# Patient Record
Sex: Male | Born: 2000 | Race: Black or African American | Hispanic: No | Marital: Single | Smoking: Never smoker
Health system: Southern US, Community
[De-identification: ages and names within clinical notes are randomized; demographics above are authoritative.]

## PROBLEM LIST (undated history)

## (undated) DIAGNOSIS — R011 Cardiac murmur, unspecified: Secondary | ICD-10-CM

---
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
## 2019-06-15 IMAGING — US US ABDOMEN LIMITED
1 series · 11 of 11 positions shown · non-contrast
Comparison: None.

CLINICAL DATA: Periumbilical pain for 4 days. Elevated white blood
cell count.

EXAM:
ULTRASOUND ABDOMEN LIMITED
TECHNIQUE: Gray scale imaging of the right lower quadrant was performed to
evaluate for suspected appendicitis. Standard imaging planes and
graded compression technique were utilized.

[Series 1: us abdomen limited · 0.09mm/px · 11 acquisitions, 11 frames shown]
[im 1/11]
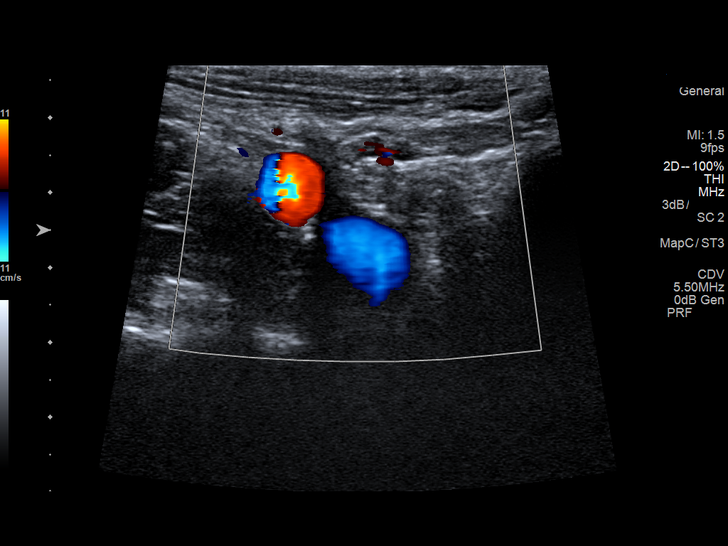
[im 2/11]
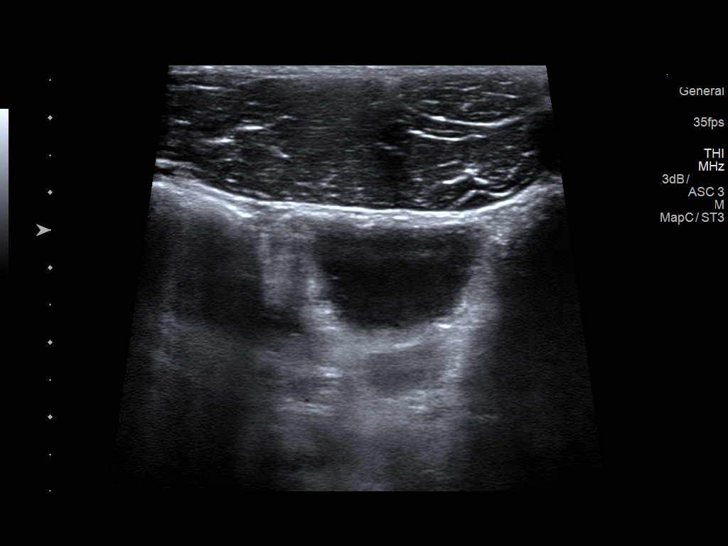
[im 3/11]
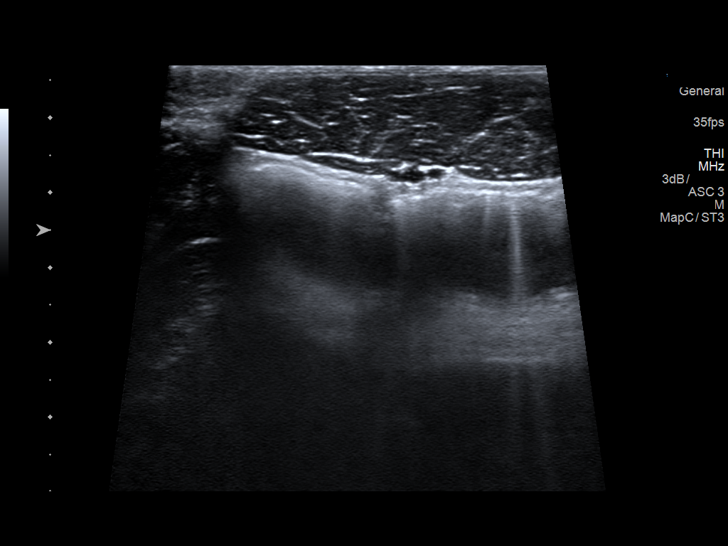
[im 4/11]
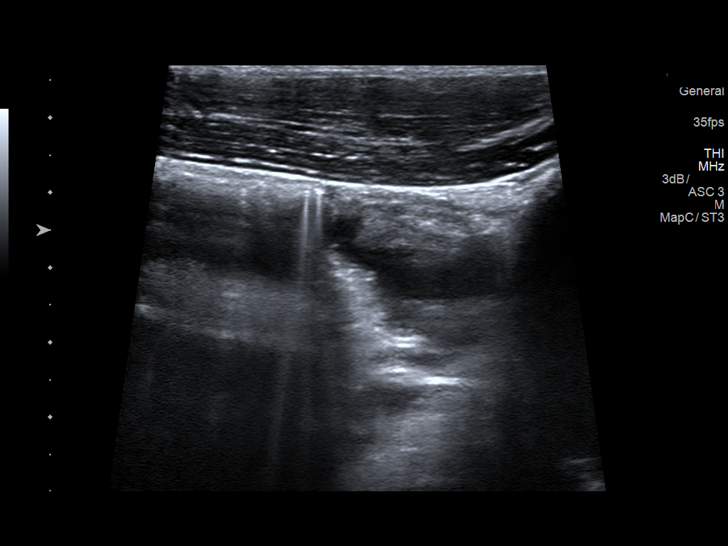
[im 5/11]
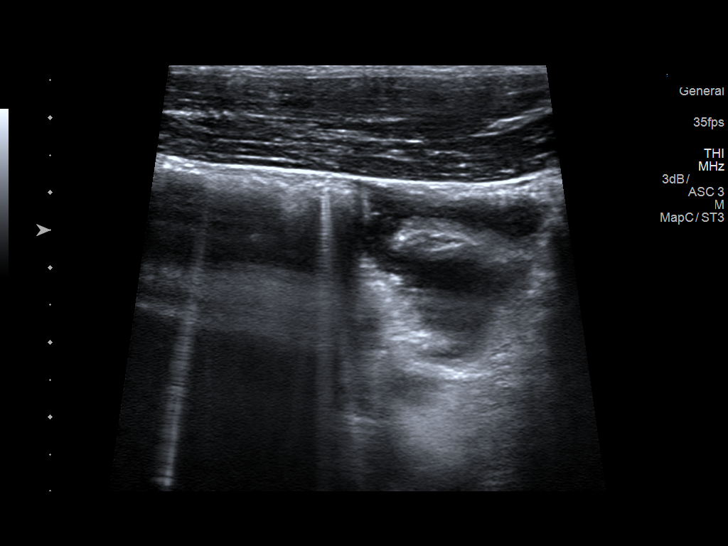
[im 6/11]
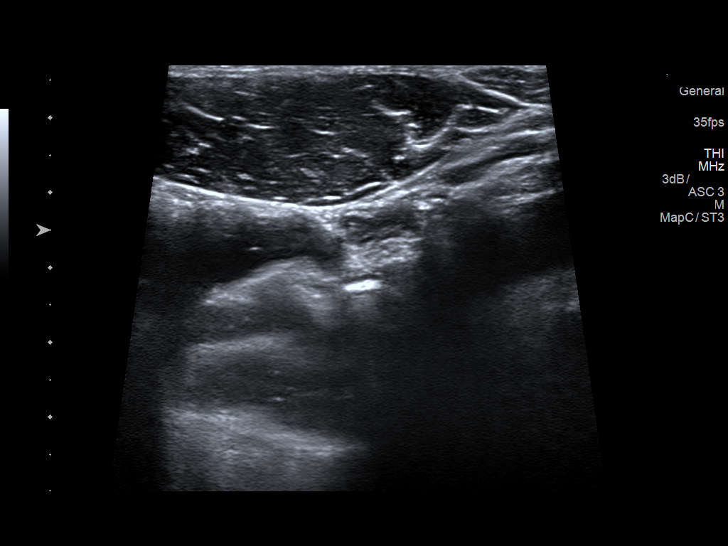
[im 7/11]
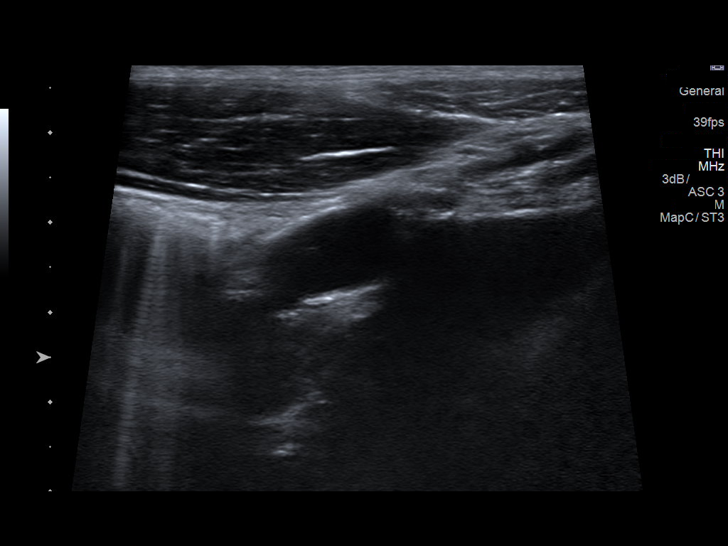
[im 8/11]
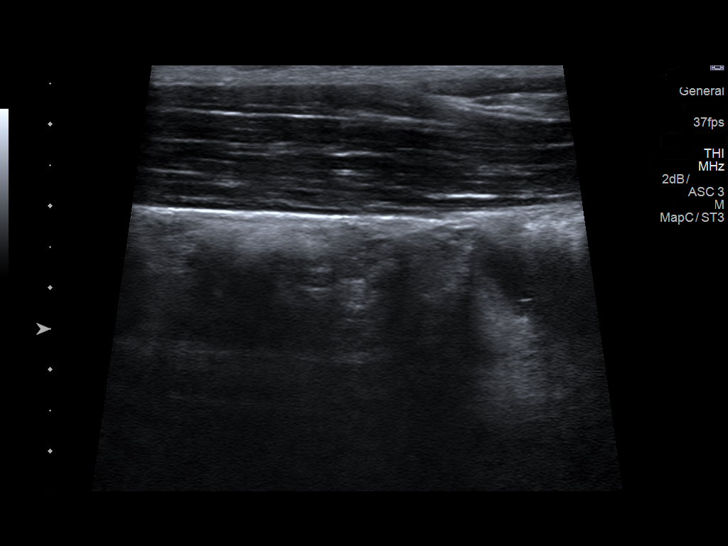
[im 9/11]
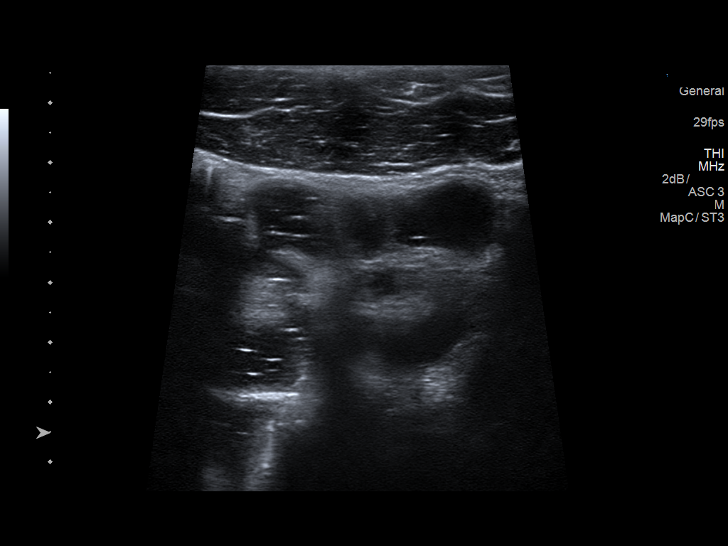
[im 10/11]
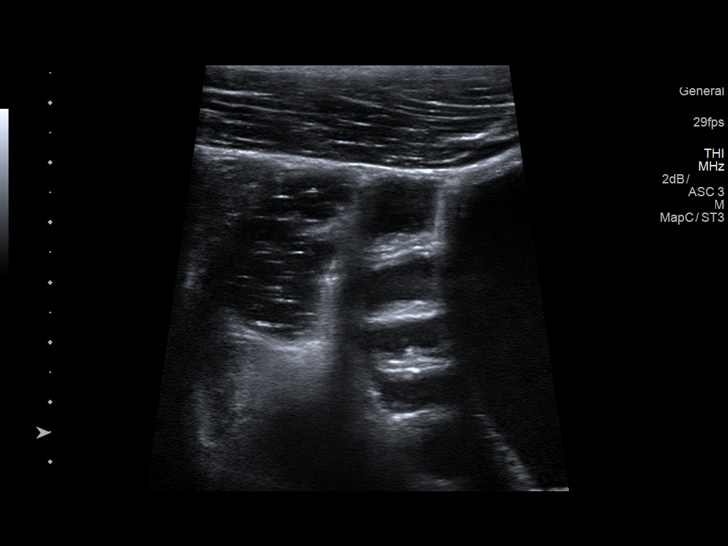
[im 11/11]
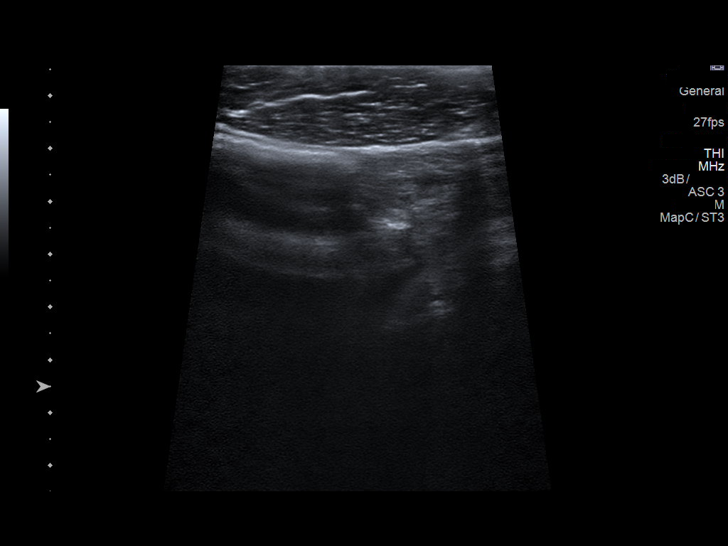

[11 of 11 positions shown; findings below may reference images not displayed]

FINDINGS: The appendix is not visualized.

Ancillary findings: None.

Factors affecting image quality: None.
IMPRESSION: The appendix is not visualized.  No abnormality is seen.

Note: Non-visualization of appendix by US does not definitely
exclude appendicitis. If there is sufficient clinical concern,
consider abdomen pelvis CT with contrast for further evaluation.

## 2024-03-30 ENCOUNTER — Emergency Department (HOSPITAL_COMMUNITY)

## 2024-03-30 ENCOUNTER — Encounter (HOSPITAL_COMMUNITY): Payer: Self-pay | Admitting: *Deleted

## 2024-03-30 ENCOUNTER — Emergency Department (HOSPITAL_COMMUNITY)
Admission: EM | Admit: 2024-03-30 | Discharge: 2024-03-30 | Disposition: A | Discharge status: Home / Self Care | Attending: Emergency Medicine | Admitting: Emergency Medicine

## 2024-03-30 ENCOUNTER — Other Ambulatory Visit: Payer: Self-pay

## 2024-03-30 DIAGNOSIS — R079 Chest pain, unspecified: Secondary | ICD-10-CM | POA: Insufficient documentation

## 2024-03-30 DIAGNOSIS — Z79899 Other long term (current) drug therapy: Secondary | ICD-10-CM | POA: Insufficient documentation

## 2024-03-30 DIAGNOSIS — R000 Tachycardia, unspecified: Secondary | ICD-10-CM | POA: Insufficient documentation

## 2024-03-30 DIAGNOSIS — R109 Unspecified abdominal pain: Secondary | ICD-10-CM | POA: Insufficient documentation

## 2024-03-30 DIAGNOSIS — R1013 Epigastric pain: Secondary | ICD-10-CM | POA: Diagnosis present

## 2024-03-30 DIAGNOSIS — R002 Palpitations: Secondary | ICD-10-CM | POA: Insufficient documentation

## 2024-03-30 DIAGNOSIS — R0689 Other abnormalities of breathing: Secondary | ICD-10-CM | POA: Diagnosis not present

## 2024-03-30 LAB — CBC
HCT: 44 % (ref 39.0–52.0)
Hemoglobin: 13.8 g/dL (ref 13.0–17.0)
MCH: 23.8 pg — ABNORMAL LOW (ref 26.0–34.0)
MCHC: 31.4 g/dL (ref 30.0–36.0)
MCV: 75.7 fL — ABNORMAL LOW (ref 80.0–100.0)
Platelets: 293 K/uL (ref 150–400)
RBC: 5.81 MIL/uL (ref 4.22–5.81)
RDW: 13.9 % (ref 11.5–15.5)
WBC: 11.2 K/uL — ABNORMAL HIGH (ref 4.0–10.5)
nRBC: 0 % (ref 0.0–0.2)

## 2024-03-30 LAB — HEPATIC FUNCTION PANEL
ALT: 23 U/L (ref 0–44)
AST: 22 U/L (ref 15–41)
Albumin: 4.7 g/dL (ref 3.5–5.0)
Alkaline Phosphatase: 54 U/L (ref 38–126)
Bilirubin, Direct: <0.1 mg/dL (ref 0.0–0.2)
Total Bilirubin: 0.7 mg/dL (ref 0.0–1.2)
Total Protein: 8 g/dL (ref 6.5–8.1)

## 2024-03-30 LAB — CBG MONITORING, ED: Glucose-Capillary: 89 mg/dL (ref 70–99)

## 2024-03-30 LAB — URINALYSIS, ROUTINE W REFLEX MICROSCOPIC
Bacteria, UA: NONE SEEN
Bilirubin Urine: NEGATIVE
Glucose, UA: NEGATIVE mg/dL
Hgb urine dipstick: NEGATIVE
Ketones, ur: NEGATIVE mg/dL
Leukocytes,Ua: NEGATIVE
Nitrite: NEGATIVE
Protein, ur: 30 mg/dL — AB
Specific Gravity, Urine: 1.027 (ref 1.005–1.030)
pH: 6 (ref 5.0–8.0)

## 2024-03-30 LAB — BRAIN NATRIURETIC PEPTIDE: B Natriuretic Peptide: 2.6 pg/mL (ref 0.0–100.0)

## 2024-03-30 LAB — BASIC METABOLIC PANEL WITH GFR
Anion gap: 13 (ref 5–15)
BUN: 17 mg/dL (ref 6–20)
CO2: 24 mmol/L (ref 22–32)
Calcium: 9.3 mg/dL (ref 8.9–10.3)
Chloride: 102 mmol/L (ref 98–111)
Creatinine, Ser: 1.43 mg/dL — ABNORMAL HIGH (ref 0.61–1.24)
GFR, Estimated: >60 mL/min (ref >60)
Glucose, Bld: 127 mg/dL — ABNORMAL HIGH (ref 70–99)
Potassium: 3.1 mmol/L — ABNORMAL LOW (ref 3.5–5.1)
Sodium: 139 mmol/L (ref 135–145)

## 2024-03-30 LAB — TROPONIN I (HIGH SENSITIVITY)
Troponin I (High Sensitivity): 6 ng/L (ref <18)
Troponin I (High Sensitivity): 8 ng/L (ref <18)

## 2024-03-30 LAB — RAPID URINE DRUG SCREEN, HOSP PERFORMED
Amphetamines: NOT DETECTED
Barbiturates: NOT DETECTED
Benzodiazepines: NOT DETECTED
Cocaine: NOT DETECTED
Opiates: POSITIVE — AB
Tetrahydrocannabinol: POSITIVE — AB

## 2024-03-30 LAB — D-DIMER, QUANTITATIVE: D-Dimer, Quant: <0.27 ug{FEU}/mL (ref 0.00–0.50)

## 2024-03-30 LAB — LIPASE, BLOOD: Lipase: 38 U/L (ref 11–51)

## 2024-03-30 MED ORDER — POTASSIUM CHLORIDE CRYS ER 20 MEQ PO TBCR
40.0000 meq | EXTENDED_RELEASE_TABLET | Freq: Once | ORAL | Status: AC
  Administered 2024-03-30: 40 meq via ORAL
  Filled 2024-03-30: qty 2

## 2024-03-30 MED ORDER — POTASSIUM CHLORIDE CRYS ER 20 MEQ PO TBCR
40.0000 meq | EXTENDED_RELEASE_TABLET | Freq: Every day | ORAL | 0 refills | Status: AC
Start: 2024-03-30 — End: 2024-04-06

## 2024-03-30 MED ORDER — PANTOPRAZOLE SODIUM 20 MG PO TBEC: 20.0000 mg | DELAYED_RELEASE_TABLET | Freq: Every day | ORAL | 0 refills | Status: AC

## 2024-03-30 MED ORDER — PANTOPRAZOLE SODIUM 40 MG IV SOLR
40.0000 mg | Freq: Once | INTRAVENOUS | Status: AC
  Administered 2024-03-30: 40 mg via INTRAVENOUS
  Filled 2024-03-30: qty 10

## 2024-03-30 MED ORDER — IOHEXOL 350 MG/ML SOLN
75.0000 mL | Freq: Once | INTRAVENOUS | Status: AC | PRN
  Administered 2024-03-30: 75 mL via INTRAVENOUS

## 2024-03-30 MED ORDER — LACTATED RINGERS IV BOLUS
1000.0000 mL | Freq: Once | INTRAVENOUS | Status: AC
  Administered 2024-03-30: 1000 mL via INTRAVENOUS

## 2024-03-30 NOTE — ED Provider Notes (Signed)
 [place] EMERGENCY DEPARTMENT AT [facility] Provider Note   CSN: [id number] Arrival date & time: 03/30/24  9843     Patient presents with: Abdominal Pain   Victor Key is a 23 y.o. male.  {Add pertinent medical, surgical, social history, OB history to HPI:6863} 23 year old male without significant past medical history who presents ER today with palpitations, epigastric pain and chest pain also feeling like he has dry mouth like he might be dehydrated.  Patient states this all started earlier today.  He had some epigastric discomfort felt like he had a throw up but when he tried to throw up nothing came out and that has since dissipated but he has felt like his heart is racing and he is kind of weak all over and having some intermittent episodes of sharp chest pain.  No shortness of breath.  No cough.  No fevers, diarrhea, constipation.  States he smokes marijuana but no other drugs.  No cigarettes, alcohol or over-the-counter medications.  No known medical history.   Abdominal Pain      Prior to Admission medications   Medication Sig Start Date End Date Taking? Authorizing Provider  mupirocin  cream (BACTROBAN ) 2 % Apply topically 3 (three) times daily. Patient not taking: Reported on 04/19/2018 05/19/13   Anitra Rocky KIDD, PA-C  ondansetron  (ZOFRAN -ODT) 4 MG disintegrating tablet Take 1 tablet (4 mg total) by mouth every 8 (eight) hours as needed for nausea or vomiting. 04/19/18   Story, Dorothyann RAMAN, NP    Allergies: Patient has no known allergies.    Review of Systems  Gastrointestinal:  Positive for abdominal pain.    Updated Vital Signs BP (!) 158/81 (BP Location: Right Arm)   Pulse (!) 107   Temp 98.2 F (36.8 C)   Resp 18   Ht 6' (1.829 m)   Wt 78.5 kg   SpO2 92%   BMI 23.47 kg/m   Physical Exam Vitals and nursing note reviewed.  Constitutional:      Appearance: He is well-developed.  HENT:     Head: Normocephalic and atraumatic.   Cardiovascular:     Rate and Rhythm: Tachycardia present.  Pulmonary:     Effort: Pulmonary effort is normal. No respiratory distress.  Abdominal:     General: There is no distension.     Tenderness: There is no abdominal tenderness.  Musculoskeletal:        General: Normal range of motion.     Cervical back: Normal range of motion.  Skin:    General: Skin is warm and dry.  Neurological:     General: No focal deficit present.     Mental Status: He is alert.     (all labs ordered are listed, but only abnormal results are displayed) Labs Reviewed  CBC - Abnormal; Notable for the following components:      Result Value   WBC 11.2 (*)    MCV 75.7 (*)    MCH 23.8 (*)    All other components within normal limits  BASIC METABOLIC PANEL WITH GFR  HEPATIC FUNCTION PANEL  BRAIN NATRIURETIC PEPTIDE  LIPASE, BLOOD  D-DIMER, QUANTITATIVE  CBG MONITORING, ED  TROPONIN I (HIGH SENSITIVITY)    EKG: EKG Interpretation Date/Time:  Thursday March 30 2024 02:04:47 EDT Ventricular Rate:  110 PR Interval:  154 QRS Duration:  94 QT Interval:  338 QTC Calculation: 457 R Axis:   81  Text Interpretation: Sinus tachycardia Biatrial enlargement ST & T wave abnormality, consider  inferior ischemia Abnormal ECG No previous ECGs available Confirmed by Lorette Mayo [phone]) on 03/30/2024 2:36:33 AM  Radiology: No results found.  {Document cardiac monitor, telemetry assessment procedure when appropriate:32947} Procedures   Medications Ordered in the ED  lactated ringers  bolus 1,000 mL (has no administration in time range)  pantoprazole  (PROTONIX ) injection 40 mg (has no administration in time range)      {Click here for ABCD2, HEART and other calculators REFRESH Note before signing:1}                              Medical Decision Making Amount and/or Complexity of Data Reviewed Labs: ordered. Radiology: ordered.  Risk Prescription drug management.  Patient with oxygen saturation of  92% on room air without any history also tachycardic and hypertensive consider possible pulmonary edema versus PE versus pneumonia.  Will get x-ray D-dimer troponin rest of labs.  Will give little fluid now as he is clinically dehydrated but will need to be stopped if his BNP or chest x-ray consistent with pulmonary edema. ***  {Document critical care time when appropriate  Document review of labs and clinical decision tools ie CHADS2VASC2, etc  Document your independent review of radiology images and any outside records  Document your discussion with family members, caretakers and with consultants  Document social determinants of health affecting pt's care  Document your decision making why or why not admission, treatments were needed:32947:::1}   Final diagnoses:  None    ED Discharge Orders     None

## 2024-03-30 NOTE — ED Triage Notes (Addendum)
 The pt is c/o abd pain for one  hour and he reports that he is nauseated but does not feel ike he can vomit.  Shortness of breath

## 2024-03-30 NOTE — ED Notes (Signed)
 Patient tolerated PO fluids without vomiting. Patient denies nausea at this time.

## 2024-03-30 NOTE — ED Notes (Signed)
 Oral hydration provided.

## 2024-03-30 NOTE — ED Notes (Signed)
 Discharge instructions reviewed.   Newly prescribed medications discussed. Pharmacy verified.   Opportunity for questions and concerns provided.   Alert, oriented and ambulatory. Displays no signs of distress.

## 2024-03-30 NOTE — ED Notes (Signed)
 Patient transported to X-ray

## 2024-08-26 ENCOUNTER — Other Ambulatory Visit: Payer: Self-pay

## 2024-08-26 ENCOUNTER — Emergency Department (HOSPITAL_COMMUNITY)

## 2024-08-26 ENCOUNTER — Emergency Department (HOSPITAL_COMMUNITY)
Admission: EM | Admit: 2024-08-26 | Discharge: 2024-08-26 | Disposition: A | Discharge status: Home / Self Care | Attending: Emergency Medicine | Admitting: Emergency Medicine

## 2024-08-26 ENCOUNTER — Encounter (HOSPITAL_COMMUNITY): Payer: Self-pay | Admitting: Emergency Medicine

## 2024-08-26 ENCOUNTER — Encounter: Payer: Self-pay | Admitting: *Deleted

## 2024-08-26 ENCOUNTER — Ambulatory Visit: Admission: EM | Admit: 2024-08-26 | Discharge: 2024-08-26 | Disposition: A | Discharge status: Home / Self Care

## 2024-08-26 DIAGNOSIS — J029 Acute pharyngitis, unspecified: Secondary | ICD-10-CM | POA: Insufficient documentation

## 2024-08-26 DIAGNOSIS — K389 Disease of appendix, unspecified: Secondary | ICD-10-CM

## 2024-08-26 DIAGNOSIS — K381 Appendicular concretions: Secondary | ICD-10-CM | POA: Diagnosis not present

## 2024-08-26 DIAGNOSIS — R109 Unspecified abdominal pain: Secondary | ICD-10-CM

## 2024-08-26 DIAGNOSIS — R509 Fever, unspecified: Secondary | ICD-10-CM | POA: Insufficient documentation

## 2024-08-26 LAB — GROUP A STREP BY PCR: Group A Strep by PCR: NOT DETECTED

## 2024-08-26 LAB — URINALYSIS, ROUTINE W REFLEX MICROSCOPIC
Bacteria, UA: NONE SEEN
Glucose, UA: NEGATIVE mg/dL
Hgb urine dipstick: NEGATIVE
Ketones, ur: 20 mg/dL — AB
Leukocytes,Ua: NEGATIVE
Nitrite: NEGATIVE
Protein, ur: >=300 mg/dL — AB
Specific Gravity, Urine: 1.044 — ABNORMAL HIGH (ref 1.005–1.030)
pH: 6 (ref 5.0–8.0)

## 2024-08-26 LAB — COMPREHENSIVE METABOLIC PANEL WITH GFR
ALT: 8 U/L (ref 0–44)
AST: 20 U/L (ref 15–41)
Albumin: 4.2 g/dL (ref 3.5–5.0)
Alkaline Phosphatase: 50 U/L (ref 38–126)
Anion gap: 14 (ref 5–15)
BUN: 17 mg/dL (ref 6–20)
CO2: 22 mmol/L (ref 22–32)
Calcium: 9.4 mg/dL (ref 8.9–10.3)
Chloride: 100 mmol/L (ref 98–111)
Creatinine, Ser: 1.16 mg/dL (ref 0.61–1.24)
GFR, Estimated: >60 mL/min
Glucose, Bld: 123 mg/dL — ABNORMAL HIGH (ref 70–99)
Potassium: 3.5 mmol/L (ref 3.5–5.1)
Sodium: 136 mmol/L (ref 135–145)
Total Bilirubin: 0.5 mg/dL (ref 0.0–1.2)
Total Protein: 7.5 g/dL (ref 6.5–8.1)

## 2024-08-26 LAB — CBC
HCT: 38.7 % — ABNORMAL LOW (ref 39.0–52.0)
Hemoglobin: 12.7 g/dL — ABNORMAL LOW (ref 13.0–17.0)
MCH: 24.2 pg — ABNORMAL LOW (ref 26.0–34.0)
MCHC: 32.8 g/dL (ref 30.0–36.0)
MCV: 73.7 fL — ABNORMAL LOW (ref 80.0–100.0)
Platelets: 213 K/uL (ref 150–400)
RBC: 5.25 MIL/uL (ref 4.22–5.81)
RDW: 14.1 % (ref 11.5–15.5)
WBC: 17.8 K/uL — ABNORMAL HIGH (ref 4.0–10.5)
nRBC: 0 % (ref 0.0–0.2)

## 2024-08-26 LAB — RESP PANEL BY RT-PCR (RSV, FLU A&B, COVID)  RVPGX2
Influenza A by PCR: NEGATIVE
Influenza B by PCR: NEGATIVE
Resp Syncytial Virus by PCR: NEGATIVE
SARS Coronavirus 2 by RT PCR: NEGATIVE

## 2024-08-26 LAB — MONONUCLEOSIS SCREEN: Mono Screen: NEGATIVE

## 2024-08-26 LAB — POCT INFLUENZA A/B
Influenza A, POC: NEGATIVE
Influenza B, POC: NEGATIVE

## 2024-08-26 MED ORDER — LACTATED RINGERS IV BOLUS
1000.0000 mL | Freq: Once | INTRAVENOUS | Status: AC
  Administered 2024-08-26: 1000 mL via INTRAVENOUS

## 2024-08-26 MED ORDER — ACETAMINOPHEN 500 MG PO TABS
1000.0000 mg | ORAL_TABLET | Freq: Once | ORAL | Status: AC | PRN
  Administered 2024-08-26: 1000 mg via ORAL
  Filled 2024-08-26: qty 2

## 2024-08-26 MED ORDER — ACETAMINOPHEN 500 MG PO TABS: 1000.0000 mg | ORAL_TABLET | Freq: Once | ORAL | Status: DC

## 2024-08-26 MED ORDER — AMOXICILLIN-POT CLAVULANATE 875-125 MG PO TABS: 1.0000 | ORAL_TABLET | Freq: Two times a day (BID) | ORAL | 0 refills | Status: AC

## 2024-08-26 MED ORDER — KETOROLAC TROMETHAMINE 30 MG/ML IJ SOLN
30.0000 mg | Freq: Once | INTRAMUSCULAR | Status: AC
  Administered 2024-08-26: 30 mg via INTRAMUSCULAR

## 2024-08-26 MED ORDER — ACETAMINOPHEN 325 MG PO TABS
650.0000 mg | ORAL_TABLET | Freq: Once | ORAL | Status: AC
  Administered 2024-08-26: 650 mg via ORAL

## 2024-08-26 MED ORDER — IBUPROFEN 400 MG PO TABS
600.0000 mg | ORAL_TABLET | Freq: Once | ORAL | Status: AC
  Administered 2024-08-26: 600 mg via ORAL
  Filled 2024-08-26: qty 1

## 2024-08-26 MED ORDER — ONDANSETRON 4 MG PO TBDP
4.0000 mg | ORAL_TABLET | Freq: Once | ORAL | Status: AC
  Administered 2024-08-26: 4 mg via ORAL

## 2024-08-26 MED ORDER — PIPERACILLIN-TAZOBACTAM 3.375 G IVPB 30 MIN
3.3750 g | Freq: Once | INTRAVENOUS | Status: AC
  Administered 2024-08-26: 3.375 g via INTRAVENOUS
  Filled 2024-08-26: qty 50

## 2024-08-26 MED ORDER — IOHEXOL 350 MG/ML SOLN
75.0000 mL | Freq: Once | INTRAVENOUS | Status: AC | PRN
  Administered 2024-08-26: 75 mL via INTRAVENOUS

## 2024-08-26 NOTE — ED Triage Notes (Addendum)
 Pt reports abdominal pain with nausea and vomiting x 3 days. Tried taking theraflu without relief. Last emesis just prior to arrival. States my stomach hurts so bad, points to mid abdomen. He thinks his last BM was Monday. Endorses THC use, last was 2 weeks ago. When asked states I have had a cough and body aches.

## 2024-08-26 NOTE — ED Notes (Signed)
 Patient is being discharged from the Urgent Care and sent to the Emergency Department via EMS . Per Corean Endo, PA-C, patient is in need of higher level of care due to fever and abd pain. Patient is aware and verbalizes understanding of plan of care.  Vitals:   08/26/24 0918  BP: 123/75  Pulse: (!) 125  Resp: 18  Temp: (!) 103 F (39.4 C)  SpO2: 97%

## 2024-08-26 NOTE — Consult Note (Signed)
 "    Victor Key [birth year]  [id number].    Requesting MD: Bernard Chief Complaint/Reason for Consult: ?appendicitis  HPI:  23 y/o M who presented to the ED with 5 days of generalized abdominal pain, nausea, and emesis as well as a sore throat. He underwent a CT that showed an appendix at the upper limit of normal with several appendicoliths without surrounding inflammation. Of note, he had a scan from July that showed a similar appearing appendix.  On exam, patient is resting in bed. NAD. He reports being somewhat hungry and asks if he can leave soon.  WBC 18. Tmax 102, HR 100s  ROS: Review of Systems  Constitutional:  Positive for fever and malaise/fatigue.  HENT: Negative.    Eyes: Negative.   Respiratory: Negative.    Cardiovascular: Negative.   Gastrointestinal:  Positive for abdominal pain, nausea and vomiting.  Genitourinary: Negative.   Musculoskeletal: Negative.   Skin: Negative.   Neurological: Negative.   Endo/Heme/Allergies: Negative.   Psychiatric/Behavioral: Negative.      History reviewed. No pertinent family history.  Past Medical History:  Diagnosis Date   Heart murmur     History reviewed. No pertinent surgical history.  Social History:  reports that he has never smoked. He has never used smokeless tobacco. He reports that he does not currently use alcohol. He reports current drug use. Drug: Marijuana.  Allergies: Allergies[1]  (Not in a hospital admission)   Physical Exam: Blood pressure 124/73, pulse 90, temperature (!) 102 F (38.9 C), temperature source Oral, resp. rate 16, height 6' (1.829 m), weight 78 kg, SpO2 100%. Gen: male, NAD Abd: soft, non-distended, mild TTP in the periumbilical and epigastric region, no rebound/guarding, no peritoneal signs  Results for orders placed or performed during the hospital encounter of 08/26/24 (from the past 48 hours)  CBC     Status: Abnormal   Collection Time: 08/26/24 11:19 AM  Result Value Ref  Range   WBC 17.8 (H) 4.0 - 10.5 K/uL   RBC 5.25 4.22 - 5.81 MIL/uL   Hemoglobin 12.7 (L) 13.0 - 17.0 g/dL   HCT 61.2 (L) 60.9 - 47.9 %   MCV 73.7 (L) 80.0 - 100.0 fL   MCH 24.2 (L) 26.0 - 34.0 pg   MCHC 32.8 30.0 - 36.0 g/dL   RDW 85.8 88.4 - 84.4 %   Platelets 213 150 - 400 K/uL   nRBC 0.0 0.0 - 0.2 %    Comment: Performed at [facility], [address] [place], [place] [address]  Comprehensive metabolic panel with GFR     Status: Abnormal   Collection Time: 08/26/24 11:19 AM  Result Value Ref Range   Sodium 136 135 - 145 mmol/L   Potassium 3.5 3.5 - 5.1 mmol/L   Chloride 100 98 - 111 mmol/L   CO2 22 22 - 32 mmol/L   Glucose, Bld 123 (H) 70 - 99 mg/dL    Comment: Glucose reference range applies only to samples taken after fasting for at least 8 hours.   BUN 17 6 - 20 mg/dL   Creatinine, Ser 8.83 0.61 - 1.24 mg/dL   Calcium  9.4 8.9 - 10.3 mg/dL   Total Protein 7.5 6.5 - 8.1 g/dL   Albumin 4.2 3.5 - 5.0 g/dL   AST 20 15 - 41 U/L   ALT 8 0 - 44 U/L   Alkaline Phosphatase 50 38 - 126 U/L   Total Bilirubin 0.5 0.0 - 1.2 mg/dL  GFR, Estimated >60 >60 mL/min    Comment: (NOTE) Calculated using the CKD-EPI Creatinine Equation (2021)    Anion gap 14 5 - 15    Comment: Performed at [facility], [address], [place], [place] [address]  Resp panel by RT-PCR (RSV, Flu A&B, Covid) Anterior Nasal Swab     Status: None   Collection Time: 08/26/24 11:19 AM   Specimen: Anterior Nasal Swab  Result Value Ref Range   SARS Coronavirus 2 by RT PCR NEGATIVE NEGATIVE   Influenza A by PCR NEGATIVE NEGATIVE   Influenza B by PCR NEGATIVE NEGATIVE    Comment: (NOTE) The Xpert Xpress SARS-CoV-2/FLU/RSV plus assay is intended as an aid in the diagnosis of influenza from Nasopharyngeal swab specimens and should not be used as a sole basis for treatment. Nasal washings and aspirates are unacceptable for Xpert Xpress SARS-CoV-2/FLU/RSV testing.  Fact Sheet for  Patients: bloggercourse.com  Fact Sheet for Healthcare Providers: seriousbroker.it  This test is not yet approved or cleared by the [place]  FDA and has been authorized for detection and/or diagnosis of SARS-CoV-2 by FDA under an Emergency Use Authorization (EUA). This EUA will remain in effect (meaning this test can be used) for the duration of the COVID-19 declaration under Section 564(b)(1) of the Act, 21 U.S.C. section 360bbb-3(b)(1), unless the authorization is terminated or revoked.     Resp Syncytial Virus by PCR NEGATIVE NEGATIVE    Comment: (NOTE) Fact Sheet for Patients: bloggercourse.com  Fact Sheet for Healthcare Providers: seriousbroker.it  This test is not yet approved or cleared by the [place]  FDA and has been authorized for detection and/or diagnosis of SARS-CoV-2 by FDA under an Emergency Use Authorization (EUA). This EUA will remain in effect (meaning this test can be used) for the duration of the COVID-19 declaration under Section 564(b)(1) of the Act, 21 U.S.C. section 360bbb-3(b)(1), unless the authorization is terminated or revoked.  Performed at [facility], [address], [place], [place] [address]   Group A Strep by PCR     Status: None   Collection Time: 08/26/24 11:19 AM   Specimen: Anterior Nasal Swab; Sterile Swab  Result Value Ref Range   Group A Strep by PCR NOT DETECTED NOT DETECTED    Comment: Performed at [facility], [address], [place], [place] [address]  Mononucleosis screen     Status: None   Collection Time: 08/26/24  3:30 PM  Result Value Ref Range   Mono Screen NEGATIVE NEGATIVE    Comment: Performed at [facility], [address], [place], [place] [address]  Urinalysis, Routine w reflex microscopic -Urine, Clean Catch     Status: Abnormal   Collection Time: 08/26/24  5:19 PM  Result Value Ref  Range   Color, Urine AMBER (A) YELLOW    Comment: BIOCHEMICALS MAY BE AFFECTED BY COLOR   APPearance HAZY (A) CLEAR   Specific Gravity, Urine 1.044 (H) 1.005 - 1.030   pH 6.0 5.0 - 8.0   Glucose, UA NEGATIVE NEGATIVE mg/dL   Hgb urine dipstick NEGATIVE NEGATIVE   Bilirubin Urine SMALL (A) NEGATIVE   Ketones, ur 20 (A) NEGATIVE mg/dL   Protein, ur >=699 (A) NEGATIVE mg/dL   Nitrite NEGATIVE NEGATIVE   Leukocytes,Ua NEGATIVE NEGATIVE   RBC / HPF 21-50 0 - 5 RBC/hpf   WBC, UA 0-5 0 - 5 WBC/hpf   Bacteria, UA NONE SEEN NONE SEEN   Squamous Epithelial / HPF 0-5  0 - 5 /HPF   Mucus PRESENT     Comment: Performed at [facility], [address], [place], [place] [address]   CT ABDOMEN PELVIS W CONTRAST Result Date: 08/26/2024 CLINICAL DATA:  Abdominal pain, acute nonlocalized. Right lower quadrant abdominal pain on palpation. Fever. EXAM: CT ABDOMEN AND PELVIS WITH CONTRAST TECHNIQUE: Multidetector CT imaging of the abdomen and pelvis was performed using the standard protocol following bolus administration of intravenous contrast. RADIATION DOSE REDUCTION: This exam was performed according to the departmental dose-optimization program which includes automated exposure control, adjustment of the mA and/or kV according to patient size and/or use of iterative reconstruction technique. CONTRAST:  75mL OMNIPAQUE  IOHEXOL  350 MG/ML SOLN COMPARISON:  03/30/2024. FINDINGS: Lower chest: No acute abnormality. Hepatobiliary: No focal liver abnormality is seen. No gallstones, gallbladder wall thickening, or biliary dilatation. Pancreas: Unremarkable. No pancreatic ductal dilatation or surrounding inflammatory changes. Spleen: Normal in size without focal abnormality. Adrenals/Urinary Tract: The adrenal glands are within normal limits. The kidneys enhance symmetrically. No renal calculus or hydronephrosis bilaterally. The bladder is unremarkable. Stomach/Bowel: The stomach is within normal limits. No bowel  obstruction, free air, or pneumatosis is seen. The appendix is prominent at 8 mm and contains appendicoliths, unchanged from the previous exam. No periappendiceal fat stranding is present. Vascular/Lymphatic: No significant vascular findings are present. No enlarged abdominal or pelvic lymph nodes. Reproductive: Prostate is unremarkable. Other: No abdominopelvic ascites. A fat containing umbilical hernia is noted. Musculoskeletal: No acute osseous abnormality. IMPRESSION: Prominent appendix with appendicoliths, unchanged from the previous exam. No definite acute fat stranding is seen at this time. Correlate clinically to exclude early appendicitis. Electronically Signed   By: Leita Birmingham M.D.   On: 08/26/2024 17:28    Assessment/Plan 23 y/o M with abdominal pain and ?appendicitis   - His exam and history are more consistent with a viral illness or possibly related to pharyngitis. His appendix is only slightly above normal and there are no additional signs to suggest appendicitis. I offered to follow up in the AM to determine possible need for OR but the patient preferred to leave from the ED with plans to return if his symptoms recurred - Dispo per ED  Cordella DELENA Polly Marlis [facility] 08/26/2024, 11:19 PM Please see Amion for pager number during day hours 7:00am-4:30pm or 7:00am -11:30am on weekends     [1] No Known Allergies  "

## 2024-08-26 NOTE — ED Notes (Signed)
Provided pt with fan

## 2024-08-26 NOTE — ED Notes (Signed)
 EMS at bedside

## 2024-08-26 NOTE — ED Notes (Signed)
 Pt requesting meds for a headache. MD notified

## 2024-08-26 NOTE — ED Triage Notes (Signed)
 Pt BIB [facility] from [facility] for RLQ abd pain on palpation.  NV since last night. Febrile at 103.5 at [facility].    650 Tylenol , 4mg  Zofran , 30 mg toradol  IM,  128/78, ETC02 19, HR 106 w/ freq PVC, CBG 162.  16G L. FA. 1L LR.  128/78  10/10 pain initially, 8/10 pain at this time.

## 2024-08-26 NOTE — ED Notes (Signed)
 Secure chat sent to MD due to 102.6 temp. Standing triage orders placed for 1000mg  tylenol .

## 2024-08-26 NOTE — Discharge Instructions (Addendum)
 Concern for appendicitis, patient is requesting EMS transport

## 2024-08-26 NOTE — Discharge Instructions (Signed)
 It was our pleasure to provide your ER care today - we hope that you feel better.  Drink plenty of fluids/stay well hydrated. Take augmentin  (antibiotic) as prescribed. Take acetaminophen  or ibuprofen  as need.   Return for recheck if reconsider and wish to be admitted. Also, return for recheck tomorrow AM if abdominal pain fails to improve/resolve.   Return to ER right away if worse, new symptoms, unable to swallow, trouble breathing, new/severe pain, worsening or severe abdominal pain, severe headache, neck pain/stiffness, or other concern.

## 2024-08-26 NOTE — ED Provider Notes (Signed)
 " [facility] Provider Note   CSN: [id number] Arrival date & time: 08/26/24  1024     Patient presents with: Abdominal Pain, Fever, Nausea, and Emesis   Victor Key is a 23 y.o. male.   Pt c/o fever, abd pain, nv, and sore throat in the past few days. Abd pain currently improved, was generalized, dull, non radiating, without specific exacerbating or alleviating factors. No back/flank pain. No dysuria or gu c/o. A couple episodes nv, no bloody or bilious emesis. Having normal bms. Sore throat is general/bilateral, no trouble breathing or swallowing. No specific known ill contacts, no known covid, strep, flu, or mono exposure. No runny nose or sinus pain. No headache. No neck pain or stiffness. No cough.  No extremity pain or swelling. No rash. Went to urgent care and was sent to ED.  The history is provided by the patient and medical records.  Abdominal Pain Associated symptoms: fever, sore throat and vomiting   Associated symptoms: no chest pain, no constipation, no cough, no diarrhea, no dysuria and no shortness of breath   Fever Associated symptoms: sore throat and vomiting   Associated symptoms: no chest pain, no cough, no diarrhea, no dysuria, no headaches and no rash   Emesis Associated symptoms: abdominal pain, fever and sore throat   Associated symptoms: no cough, no diarrhea and no headaches        Prior to Admission medications  Medication Sig Start Date End Date Taking? Authorizing Provider  amoxicillin -clavulanate (AUGMENTIN ) 875-125 MG tablet Take 1 tablet by mouth every 12 (twelve) hours. 08/27/24  Yes Bernard Drivers, MD  mupirocin  cream (BACTROBAN ) 2 % Apply topically 3 (three) times daily. Patient not taking: Reported on 04/19/2018 05/19/13   Anitra Rocky KIDD, PA-C  ondansetron  (ZOFRAN -ODT) 4 MG disintegrating tablet Take 1 tablet (4 mg total) by mouth every 8 (eight) hours as needed for nausea or vomiting. Patient not  taking: Reported on 08/26/2024 04/19/18   Lum Dorothyann RAMAN, NP  pantoprazole  (PROTONIX ) 20 MG tablet Take 1 tablet (20 mg total) by mouth daily. Patient not taking: Reported on 08/26/2024 03/30/24   Mesner, Selinda, MD  potassium chloride  SA (KLOR-CON  M) 20 MEQ tablet Take 2 tablets (40 mEq total) by mouth daily for 7 days. Patient not taking: Reported on 08/26/2024 03/30/24 04/06/24  Mesner, Selinda, MD    Allergies: Patient has no known allergies.    Review of Systems  Constitutional:  Positive for fever.  HENT:  Positive for sore throat. Negative for sinus pain.   Eyes:  Negative for pain, discharge and redness.  Respiratory:  Negative for cough and shortness of breath.   Cardiovascular:  Negative for chest pain.  Gastrointestinal:  Positive for abdominal pain and vomiting. Negative for constipation and diarrhea.  Genitourinary:  Negative for dysuria, flank pain, scrotal swelling and testicular pain.  Musculoskeletal:  Negative for back pain, neck pain and neck stiffness.  Skin:  Negative for rash.  Neurological:  Negative for headaches.    Updated Vital Signs BP 134/78   Pulse 91   Temp (!) 102.1 F (38.9 C) (Oral)   Resp 16   Ht 1.829 m (6')   Wt 78 kg   SpO2 100%   BMI 23.32 kg/m   Physical Exam Vitals and nursing note reviewed.  Constitutional:      Appearance: Normal appearance. He is well-developed.  HENT:     Head: Atraumatic.     Right Ear: Tympanic membrane  and ear canal normal.     Left Ear: Tympanic membrane and ear canal normal.     Nose: Nose normal.     Mouth/Throat:     Mouth: Mucous membranes are moist.     Pharynx: Oropharyngeal exudate and posterior oropharyngeal erythema present.     Comments: Pharynx erythematous w exudate. No asymmetric swelling or abscess. No trismus.  Eyes:     General: No scleral icterus.    Conjunctiva/sclera: Conjunctivae normal.     Pupils: Pupils are equal, round, and reactive to light.  Neck:     Trachea: No tracheal  deviation.     Comments: Anterior cervical l/a, no posterior cervical or other l/a appreciated. Trachea midline, thyroid not grossly enlarged or tender. No neck stiffness or rigidity.  Cardiovascular:     Rate and Rhythm: Normal rate and regular rhythm.     Pulses: Normal pulses.     Heart sounds: Normal heart sounds. No murmur heard.    No friction rub. No gallop.  Pulmonary:     Effort: Pulmonary effort is normal. No accessory muscle usage or respiratory distress.     Breath sounds: Normal breath sounds.  Abdominal:     General: Bowel sounds are normal. There is no distension.     Palpations: Abdomen is soft.     Tenderness: There is no abdominal tenderness. There is no guarding.     Comments: No HSM.  Genitourinary:    Comments: No cva tenderness. Musculoskeletal:        General: No swelling or tenderness.     Cervical back: Normal range of motion and neck supple. No rigidity.     Right lower leg: No edema.     Left lower leg: No edema.  Skin:    General: Skin is warm and dry.     Findings: No rash.  Neurological:     Mental Status: He is alert.     Comments: Alert, speech clear.   Psychiatric:        Mood and Affect: Mood normal.     (all labs ordered are listed, but only abnormal results are displayed) Results for orders placed or performed during the hospital encounter of 08/26/24  Resp panel by RT-PCR (RSV, Flu A&B, Covid) Anterior Nasal Swab   Collection Time: 08/26/24 11:19 AM   Specimen: Anterior Nasal Swab  Result Value Ref Range   SARS Coronavirus 2 by RT PCR NEGATIVE NEGATIVE   Influenza A by PCR NEGATIVE NEGATIVE   Influenza B by PCR NEGATIVE NEGATIVE   Resp Syncytial Virus by PCR NEGATIVE NEGATIVE  Group A Strep by PCR   Collection Time: 08/26/24 11:19 AM   Specimen: Anterior Nasal Swab; Sterile Swab  Result Value Ref Range   Group A Strep by PCR NOT DETECTED NOT DETECTED  CBC   Collection Time: 08/26/24 11:19 AM  Result Value Ref Range   WBC 17.8 (H)  4.0 - 10.5 K/uL   RBC 5.25 4.22 - 5.81 MIL/uL   Hemoglobin 12.7 (L) 13.0 - 17.0 g/dL   HCT 61.2 (L) 60.9 - 47.9 %   MCV 73.7 (L) 80.0 - 100.0 fL   MCH 24.2 (L) 26.0 - 34.0 pg   MCHC 32.8 30.0 - 36.0 g/dL   RDW 85.8 88.4 - 84.4 %   Platelets 213 150 - 400 K/uL   nRBC 0.0 0.0 - 0.2 %  Comprehensive metabolic panel with GFR   Collection Time: 08/26/24 11:19 AM  Result Value Ref Range  Sodium 136 135 - 145 mmol/L   Potassium 3.5 3.5 - 5.1 mmol/L   Chloride 100 98 - 111 mmol/L   CO2 22 22 - 32 mmol/L   Glucose, Bld 123 (H) 70 - 99 mg/dL   BUN 17 6 - 20 mg/dL   Creatinine, Ser 8.83 0.61 - 1.24 mg/dL   Calcium  9.4 8.9 - 10.3 mg/dL   Total Protein 7.5 6.5 - 8.1 g/dL   Albumin 4.2 3.5 - 5.0 g/dL   AST 20 15 - 41 U/L   ALT 8 0 - 44 U/L   Alkaline Phosphatase 50 38 - 126 U/L   Total Bilirubin 0.5 0.0 - 1.2 mg/dL   GFR, Estimated >39 >39 mL/min   Anion gap 14 5 - 15  Mononucleosis screen   Collection Time: 08/26/24  3:30 PM  Result Value Ref Range   Mono Screen NEGATIVE NEGATIVE  Urinalysis, Routine w reflex microscopic -Urine, Clean Catch   Collection Time: 08/26/24  5:19 PM  Result Value Ref Range   Color, Urine AMBER (A) YELLOW   APPearance HAZY (A) CLEAR   Specific Gravity, Urine 1.044 (H) 1.005 - 1.030   pH 6.0 5.0 - 8.0   Glucose, UA NEGATIVE NEGATIVE mg/dL   Hgb urine dipstick NEGATIVE NEGATIVE   Bilirubin Urine SMALL (A) NEGATIVE   Ketones, ur 20 (A) NEGATIVE mg/dL   Protein, ur >=699 (A) NEGATIVE mg/dL   Nitrite NEGATIVE NEGATIVE   Leukocytes,Ua NEGATIVE NEGATIVE   RBC / HPF 21-50 0 - 5 RBC/hpf   WBC, UA 0-5 0 - 5 WBC/hpf   Bacteria, UA NONE SEEN NONE SEEN   Squamous Epithelial / HPF 0-5 0 - 5 /HPF   Mucus PRESENT       EKG: None  Radiology: CT ABDOMEN PELVIS W CONTRAST Result Date: 08/26/2024 CLINICAL DATA:  Abdominal pain, acute nonlocalized. Right lower quadrant abdominal pain on palpation. Fever. EXAM: CT ABDOMEN AND PELVIS WITH CONTRAST TECHNIQUE:  Multidetector CT imaging of the abdomen and pelvis was performed using the standard protocol following bolus administration of intravenous contrast. RADIATION DOSE REDUCTION: This exam was performed according to the departmental dose-optimization program which includes automated exposure control, adjustment of the mA and/or kV according to patient size and/or use of iterative reconstruction technique. CONTRAST:  75mL OMNIPAQUE  IOHEXOL  350 MG/ML SOLN COMPARISON:  03/30/2024. FINDINGS: Lower chest: No acute abnormality. Hepatobiliary: No focal liver abnormality is seen. No gallstones, gallbladder wall thickening, or biliary dilatation. Pancreas: Unremarkable. No pancreatic ductal dilatation or surrounding inflammatory changes. Spleen: Normal in size without focal abnormality. Adrenals/Urinary Tract: The adrenal glands are within normal limits. The kidneys enhance symmetrically. No renal calculus or hydronephrosis bilaterally. The bladder is unremarkable. Stomach/Bowel: The stomach is within normal limits. No bowel obstruction, free air, or pneumatosis is seen. The appendix is prominent at 8 mm and contains appendicoliths, unchanged from the previous exam. No periappendiceal fat stranding is present. Vascular/Lymphatic: No significant vascular findings are present. No enlarged abdominal or pelvic lymph nodes. Reproductive: Prostate is unremarkable. Other: No abdominopelvic ascites. A fat containing umbilical hernia is noted. Musculoskeletal: No acute osseous abnormality. IMPRESSION: Prominent appendix with appendicoliths, unchanged from the previous exam. No definite acute fat stranding is seen at this time. Correlate clinically to exclude early appendicitis. Electronically Signed   By: Leita Birmingham M.D.   On: 08/26/2024 17:28     Procedures   Medications Ordered in the ED  piperacillin -tazobactam (ZOSYN ) IVPB 3.375 g (has no administration in time range)  ibuprofen  (ADVIL )  tablet 600 mg (has no administration  in time range)  lactated ringers  bolus 1,000 mL (0 mLs Intravenous Stopped 08/26/24 1316)  iohexol  (OMNIPAQUE ) 350 MG/ML injection 75 mL (75 mLs Intravenous Contrast Given 08/26/24 1621)  acetaminophen  (TYLENOL ) tablet 1,000 mg (1,000 mg Oral Given 08/26/24 1715)                                    Medical Decision Making Problems Addressed: Abdominal pain, unspecified abdominal location: acute illness or injury with systemic symptoms that poses a threat to life or bodily functions Acute febrile illness: acute illness or injury with systemic symptoms that poses a threat to life or bodily functions Appendicolith: chronic illness or injury Exudative pharyngitis: acute illness or injury with systemic symptoms  Amount and/or Complexity of Data Reviewed Independent Historian:     Details: Family/friend.  External Data Reviewed: notes. Labs: ordered. Radiology: ordered. Discussion of management or test interpretation with external provider(s): Gen surgery.   Risk OTC drugs. Prescription drug management. Decision regarding hospitalization.   Iv ns. Labs ordered/sent. Imaging ordered.   Differential diagnosis includes strep, abd process, other febrile illness, etc. Dispo decision including potential need for admission considered - will get labs and imaging and reassess.   Reviewed nursing notes and prior charts for additional history. External reports reviewed.   LR bolus.   Labs reviewed/interpreted by me - wbc elevated. Chem unremarkable. Ua neg for uti. Covid and flu neg. Strep neg. Mono neg.   CT reviewed/interpreted by me - prominent appendix, appendicolith - similar prominent appendix on remote prior imaging. Given abd pain, fever, nv, ct, etc - general surgery consulted for possible admission, appendectomy.   Dr Polly oris n ED, and indicates pt is not wanting admission or surgery and is requesting d/c, and that he discussed risks/benefits.     I rechecked, pt is  requesting d/c. Pt does have exudative pharyngitis on exam. Will tx w abx.  Initial dose given in ed.  Return precautions discussed, including that if abd pain persists or worsens, to return tomorrow for recheck.   Recheck abd soft, no peritoneal signs, no focal rlq tenderness. Is drinking fluids without difficulty. No new or worsening throat pain or swelling. Pt is breathing comfortably. Chest cta. Pulse ox 100%. No headache. No neck stiffness or rigidity.   Pt requesting d/c from ED.   Rec close f/u tomorrow for recheck.  Return precautions provided.       Final diagnoses:  Acute febrile illness  Exudative pharyngitis  Abdominal pain, unspecified abdominal location  Appendicolith    ED Discharge Orders          Ordered    amoxicillin -clavulanate (AUGMENTIN ) 875-125 MG tablet  Every 12 hours        08/26/24 2016               Dawsyn Ramsaran, MD 08/26/24 2019  "

## 2024-08-26 NOTE — ED Notes (Signed)
Pt requesting meds for headache. MD notified.

## 2024-08-26 NOTE — ED Notes (Signed)
MD aware of pt temperature.

## 2024-08-26 NOTE — ED Provider Notes (Signed)
 " Victor Key    CSN: [id number] Arrival date & time: 08/26/24  0808      History   Chief Complaint Chief Complaint  Patient presents with   Abdominal Pain   Fever    HPI Victor Key is a 23 y.o. male.   Pt presents today due to marked abdominal pain, vomiting, and fever for the past 3 days. Pt ranks pain a 10/10 and denies diarrhea. Pt states that he cannot quantify the episodes of vomiting that he has had. Pt denies known sick contacts or suspect food or water intake.   The history is provided by the patient.  Abdominal Pain Associated symptoms: fever   Fever   Past Medical History:  Diagnosis Date   Heart murmur     There are no active problems to display for this patient.   History reviewed. No pertinent surgical history.     Home Medications    Prior to Admission medications  Medication Sig Start Date End Date Taking? Authorizing Provider  mupirocin  cream (BACTROBAN ) 2 % Apply topically 3 (three) times daily. Patient not taking: Reported on 04/19/2018 05/19/13   Anitra Rocky KIDD, PA-C  ondansetron  (ZOFRAN -ODT) 4 MG disintegrating tablet Take 1 tablet (4 mg total) by mouth every 8 (eight) hours as needed for nausea or vomiting. Patient not taking: Reported on 08/26/2024 04/19/18   Lum Dorothyann RAMAN, NP  pantoprazole  (PROTONIX ) 20 MG tablet Take 1 tablet (20 mg total) by mouth daily. Patient not taking: Reported on 08/26/2024 03/30/24   Mesner, Selinda, MD  potassium chloride  SA (KLOR-CON  M) 20 MEQ tablet Take 2 tablets (40 mEq total) by mouth daily for 7 days. Patient not taking: Reported on 08/26/2024 03/30/24 04/06/24  Mesner, Selinda, MD    Family History History reviewed. No pertinent family history.  Social History Social History[1]   Allergies   Patient has no known allergies.   Review of Systems Review of Systems  Constitutional:  Positive for fever.  Gastrointestinal:  Positive for abdominal pain.     Physical Exam Triage  Vital Signs ED Triage Vitals  Encounter Vitals Group     BP 08/26/24 0918 123/75     Girls Systolic BP Percentile --      Girls Diastolic BP Percentile --      Boys Systolic BP Percentile --      Boys Diastolic BP Percentile --      Pulse Rate 08/26/24 0918 (!) 125     Resp 08/26/24 0918 18     Temp 08/26/24 0918 (!) 103 F (39.4 C)     Temp Source 08/26/24 0918 Oral     SpO2 08/26/24 0918 97 %     Weight --      Height --      Head Circumference --      Peak Flow --      Pain Score 08/26/24 0938 9     Pain Loc --      Pain Education --      Exclude from Growth Chart --    No data found.  Updated Vital Signs BP 123/75 (BP Location: Left Arm)   Pulse (!) 125   Temp (!) 103 F (39.4 C) (Oral)   Resp 18   SpO2 97%   Visual Acuity Right Eye Distance:   Left Eye Distance:   Bilateral Distance:    Right Eye Near:   Left Eye Near:    Bilateral Near:     Physical Exam  Vitals and nursing note reviewed.  Constitutional:      General: He is not in acute distress.    Appearance: He is well-developed. He is not ill-appearing, toxic-appearing or diaphoretic.  Eyes:     General: No scleral icterus. Cardiovascular:     Rate and Rhythm: Normal rate and regular rhythm.     Heart sounds: Normal heart sounds.  Pulmonary:     Effort: Pulmonary effort is normal. No respiratory distress.     Breath sounds: Normal breath sounds. No wheezing or rhonchi.  Abdominal:     General: Abdomen is flat. Bowel sounds are normal.     Palpations: Abdomen is soft.     Tenderness: There is abdominal tenderness in the right lower quadrant.     Comments: Most significant tenderness to palpation of RLQ  Skin:    General: Skin is warm.  Neurological:     Mental Status: He is alert and oriented to person, place, and time.  Psychiatric:        Mood and Affect: Mood normal.        Behavior: Behavior normal.      UC Treatments / Results  Labs (all labs ordered are listed, but only abnormal  results are displayed) Labs Reviewed  POCT INFLUENZA A/B    EKG   Radiology No results found.  Procedures Procedures (including critical Key time)  Medications Ordered in UC Medications  ondansetron  (ZOFRAN -ODT) disintegrating tablet 4 mg (4 mg Oral Given 08/26/24 0914)  acetaminophen  (TYLENOL ) tablet 650 mg (650 mg Oral Given 08/26/24 0933)  ketorolac  (TORADOL ) 30 MG/ML injection 30 mg (30 mg Intramuscular Given 08/26/24 0938)    Initial Impression / Assessment and Plan / UC Course  I have reviewed the triage vital signs and the nursing notes.  Pertinent labs & imaging results that were available during my Key of the patient were reviewed by me and considered in my medical decision making (see chart for details).    Final Clinical Impressions(s) / UC Diagnoses   Final diagnoses:  Abdominal pain, unspecified abdominal location     Discharge Instructions      Concern for appendicitis, patient is requesting EMS transport    ED Prescriptions   None    PDMP not reviewed this encounter.    [1]  Social History Tobacco Use   Smoking status: Never   Smokeless tobacco: Never  Vaping Use   Vaping status: Never Used  Substance Use Topics   Alcohol use: Not Currently   Drug use: Yes    Types: Marijuana     Andra Corean BROCKS, PA-C 08/26/24 9056  "
